# Patient Record
Sex: Male | Born: 1940 | Race: White | Hispanic: No | Marital: Married | Smoking: Never smoker
Health system: Southern US, Community
[De-identification: ages and names within clinical notes are randomized; demographics above are authoritative.]

## PROBLEM LIST (undated history)

## (undated) DIAGNOSIS — E785 Hyperlipidemia, unspecified: Secondary | ICD-10-CM

## (undated) DIAGNOSIS — R112 Nausea with vomiting, unspecified: Secondary | ICD-10-CM

## (undated) DIAGNOSIS — M1990 Unspecified osteoarthritis, unspecified site: Secondary | ICD-10-CM

## (undated) DIAGNOSIS — I10 Essential (primary) hypertension: Secondary | ICD-10-CM

## (undated) DIAGNOSIS — R413 Other amnesia: Secondary | ICD-10-CM

## (undated) DIAGNOSIS — Z9889 Other specified postprocedural states: Secondary | ICD-10-CM

---

## 1999-08-28 ENCOUNTER — Encounter: Payer: Self-pay | Admitting: Orthopaedic Surgery

## 1999-08-28 ENCOUNTER — Ambulatory Visit (HOSPITAL_COMMUNITY): Admission: RE | Admit: 1999-08-28 | Discharge: 1999-08-28 | Payer: Self-pay | Admitting: Orthopaedic Surgery

## 2019-08-03 ENCOUNTER — Other Ambulatory Visit: Payer: Self-pay | Admitting: Neurosurgery

## 2019-08-07 ENCOUNTER — Encounter (HOSPITAL_COMMUNITY): Payer: Self-pay | Admitting: Neurosurgery

## 2019-08-07 NOTE — Progress Notes (Signed)
PCP - Dr Foster Simpson Cardiologist - n/a  Chest x-ray - n/a EKG - DOS 08/08/19 Stress Test - n/a ECHO - n/a Cardiac Cath - n/a  Aspirin Instructions: Follow your surgeon's instructions on when to stop aspirin prior to surgery,  If no instructions were given by your surgeon then you will need to call the office for those instructions.  STOP now taking any Aspirin (unless otherwise instructed by your surgeon), Aleve, Naproxen, Ibuprofen, Motrin, Advil, Goody's, BC's, all herbal medications, fish oil, and all vitamins.   Coronavirus Screening Covid test on DOS 08/08/19  Sister Magda Paganini states patient does not have any of the following: Do you have any of the following symptoms:  Cough yes/no: No Fever (>100.61F)  yes/no: No Runny nose yes/no: No Sore throat yes/no: No Difficulty breathing/shortness of breath  yes/no: No  Have you traveled in the last 14 days and where? yes/no: No  Sister Magda Paganini verbalized understanding of instructions that were given via phone.

## 2019-08-08 ENCOUNTER — Ambulatory Visit (HOSPITAL_COMMUNITY): Payer: Medicare PPO

## 2019-08-08 ENCOUNTER — Ambulatory Visit (HOSPITAL_COMMUNITY): Payer: Medicare PPO | Admitting: Anesthesiology

## 2019-08-08 ENCOUNTER — Ambulatory Visit (HOSPITAL_COMMUNITY)
Admission: RE | Admit: 2019-08-08 | Discharge: 2019-08-09 | Disposition: A | Discharge status: Home / Self Care | Payer: Medicare PPO | Attending: Neurosurgery | Admitting: Neurosurgery

## 2019-08-08 ENCOUNTER — Other Ambulatory Visit: Payer: Self-pay

## 2019-08-08 ENCOUNTER — Encounter (HOSPITAL_COMMUNITY): Payer: Self-pay | Admitting: Neurosurgery

## 2019-08-08 ENCOUNTER — Encounter (HOSPITAL_COMMUNITY)
Admission: RE | Disposition: A | Discharge status: Home / Self Care | Payer: Self-pay | Source: Home / Self Care | Attending: Neurosurgery

## 2019-08-08 DIAGNOSIS — Z7982 Long term (current) use of aspirin: Secondary | ICD-10-CM | POA: Diagnosis not present

## 2019-08-08 DIAGNOSIS — I10 Essential (primary) hypertension: Secondary | ICD-10-CM | POA: Insufficient documentation

## 2019-08-08 DIAGNOSIS — M5126 Other intervertebral disc displacement, lumbar region: Secondary | ICD-10-CM | POA: Diagnosis present

## 2019-08-08 DIAGNOSIS — M5116 Intervertebral disc disorders with radiculopathy, lumbar region: Secondary | ICD-10-CM | POA: Insufficient documentation

## 2019-08-08 DIAGNOSIS — Z20822 Contact with and (suspected) exposure to covid-19: Secondary | ICD-10-CM | POA: Insufficient documentation

## 2019-08-08 DIAGNOSIS — Z79899 Other long term (current) drug therapy: Secondary | ICD-10-CM | POA: Diagnosis not present

## 2019-08-08 DIAGNOSIS — Z419 Encounter for procedure for purposes other than remedying health state, unspecified: Secondary | ICD-10-CM

## 2019-08-08 DIAGNOSIS — M7138 Other bursal cyst, other site: Secondary | ICD-10-CM | POA: Diagnosis not present

## 2019-08-08 DIAGNOSIS — E785 Hyperlipidemia, unspecified: Secondary | ICD-10-CM | POA: Insufficient documentation

## 2019-08-08 DIAGNOSIS — M479 Spondylosis, unspecified: Secondary | ICD-10-CM | POA: Insufficient documentation

## 2019-08-08 DIAGNOSIS — M48061 Spinal stenosis, lumbar region without neurogenic claudication: Secondary | ICD-10-CM | POA: Diagnosis not present

## 2019-08-08 DIAGNOSIS — Z7952 Long term (current) use of systemic steroids: Secondary | ICD-10-CM | POA: Insufficient documentation

## 2019-08-08 HISTORY — DX: Unspecified osteoarthritis, unspecified site: M19.90

## 2019-08-08 HISTORY — DX: Essential (primary) hypertension: I10

## 2019-08-08 HISTORY — DX: Nausea with vomiting, unspecified: R11.2

## 2019-08-08 HISTORY — DX: Hyperlipidemia, unspecified: E78.5

## 2019-08-08 HISTORY — DX: Other specified postprocedural states: Z98.890

## 2019-08-08 HISTORY — DX: Other amnesia: R41.3

## 2019-08-08 HISTORY — PX: LUMBAR LAMINECTOMY/DECOMPRESSION MICRODISCECTOMY: SHX5026

## 2019-08-08 LAB — BASIC METABOLIC PANEL
Anion gap: 13 (ref 5–15)
BUN: 22 mg/dL (ref 8–23)
CO2: 22 mmol/L (ref 22–32)
Calcium: 9.9 mg/dL (ref 8.9–10.3)
Chloride: 102 mmol/L (ref 98–111)
Creatinine, Ser: 1.33 mg/dL — ABNORMAL HIGH (ref 0.61–1.24)
GFR calc Af Amer: 59 mL/min — ABNORMAL LOW (ref >60)
GFR calc non Af Amer: 51 mL/min — ABNORMAL LOW (ref >60)
Glucose, Bld: 105 mg/dL — ABNORMAL HIGH (ref 70–99)
Potassium: 4 mmol/L (ref 3.5–5.1)
Sodium: 137 mmol/L (ref 135–145)

## 2019-08-08 LAB — CBC
HCT: 48.5 % (ref 39.0–52.0)
Hemoglobin: 17 g/dL (ref 13.0–17.0)
MCH: 31 pg (ref 26.0–34.0)
MCHC: 35.1 g/dL (ref 30.0–36.0)
MCV: 88.5 fL (ref 80.0–100.0)
Platelets: 215 10*3/uL (ref 150–400)
RBC: 5.48 MIL/uL (ref 4.22–5.81)
RDW: 13.3 % (ref 11.5–15.5)
WBC: 8.4 10*3/uL (ref 4.0–10.5)
nRBC: 0 % (ref 0.0–0.2)

## 2019-08-08 LAB — SARS CORONAVIRUS 2 BY RT PCR (DIASORIN): SARS Coronavirus 2: NEGATIVE

## 2019-08-08 SURGERY — LUMBAR LAMINECTOMY/DECOMPRESSION MICRODISCECTOMY 1 LEVEL
Anesthesia: General | Site: Back | Laterality: Right

## 2019-08-08 MED ORDER — EPHEDRINE SULFATE 50 MG/ML IJ SOLN
INTRAMUSCULAR | Status: DC | PRN
  Administered 2019-08-08: 5 mg via INTRAVENOUS

## 2019-08-08 MED ORDER — FENTANYL CITRATE (PF) 250 MCG/5ML IJ SOLN
INTRAMUSCULAR | Status: AC
  Filled 2019-08-08: qty 5

## 2019-08-08 MED ORDER — SODIUM CHLORIDE 0.9% FLUSH: 3.0000 mL | INTRAVENOUS | Status: DC | PRN

## 2019-08-08 MED ORDER — ROCURONIUM BROMIDE 10 MG/ML (PF) SYRINGE
PREFILLED_SYRINGE | INTRAVENOUS | Status: AC
  Filled 2019-08-08: qty 20

## 2019-08-08 MED ORDER — PROPOFOL 500 MG/50ML IV EMUL
INTRAVENOUS | Status: DC | PRN
Start: 2019-08-08 — End: 2019-08-08
  Administered 2019-08-08: 25 ug/kg/min via INTRAVENOUS

## 2019-08-08 MED ORDER — MIDAZOLAM HCL 2 MG/2ML IJ SOLN
INTRAMUSCULAR | Status: AC
  Filled 2019-08-08: qty 2

## 2019-08-08 MED ORDER — ORAL CARE MOUTH RINSE: 15.0000 mL | Freq: Once | OROMUCOSAL | Status: AC

## 2019-08-08 MED ORDER — ACETAMINOPHEN 650 MG RE SUPP: 650.0000 mg | RECTAL | Status: DC | PRN

## 2019-08-08 MED ORDER — FENTANYL CITRATE (PF) 250 MCG/5ML IJ SOLN
INTRAMUSCULAR | Status: DC | PRN
  Administered 2019-08-08 (×4): 50 ug via INTRAVENOUS

## 2019-08-08 MED ORDER — ESMOLOL HCL 100 MG/10ML IV SOLN
INTRAVENOUS | Status: AC
  Filled 2019-08-08: qty 10

## 2019-08-08 MED ORDER — PANTOPRAZOLE SODIUM 40 MG PO TBEC
40.0000 mg | DELAYED_RELEASE_TABLET | Freq: Every day | ORAL | Status: DC
  Administered 2019-08-08: 40 mg via ORAL
  Filled 2019-08-08: qty 1

## 2019-08-08 MED ORDER — LIDOCAINE-EPINEPHRINE 1 %-1:100000 IJ SOLN
INTRAMUSCULAR | Status: AC
  Filled 2019-08-08: qty 1

## 2019-08-08 MED ORDER — BUPIVACAINE HCL (PF) 0.25 % IJ SOLN
INTRAMUSCULAR | Status: AC
  Filled 2019-08-08: qty 30

## 2019-08-08 MED ORDER — DIPHENHYDRAMINE HCL 50 MG/ML IJ SOLN
INTRAMUSCULAR | Status: DC | PRN
Start: 2019-08-08 — End: 2019-08-08
  Administered 2019-08-08: 12.5 mg via INTRAVENOUS

## 2019-08-08 MED ORDER — FENTANYL CITRATE (PF) 100 MCG/2ML IJ SOLN
25.0000 ug | INTRAMUSCULAR | Status: DC | PRN
  Administered 2019-08-08: 25 ug via INTRAVENOUS

## 2019-08-08 MED ORDER — DEXAMETHASONE SODIUM PHOSPHATE 10 MG/ML IJ SOLN
10.0000 mg | Freq: Once | INTRAMUSCULAR | Status: DC
  Filled 2019-08-08: qty 1

## 2019-08-08 MED ORDER — LIDOCAINE 2% (20 MG/ML) 5 ML SYRINGE
INTRAMUSCULAR | Status: DC | PRN
  Administered 2019-08-08: 20 mg via INTRAVENOUS

## 2019-08-08 MED ORDER — THROMBIN 5000 UNITS EX SOLR
CUTANEOUS | Status: AC
  Filled 2019-08-08: qty 10000

## 2019-08-08 MED ORDER — CYCLOBENZAPRINE HCL 10 MG PO TABS
10.0000 mg | ORAL_TABLET | Freq: Three times a day (TID) | ORAL | Status: DC | PRN
  Administered 2019-08-09: 10 mg via ORAL
  Filled 2019-08-08: qty 1

## 2019-08-08 MED ORDER — METHYLPREDNISOLONE 4 MG PO TABS
4.0000 mg | ORAL_TABLET | Freq: Every day | ORAL | Status: DC
  Administered 2019-08-08 – 2019-08-09 (×2): 4 mg via ORAL
  Filled 2019-08-08 (×3): qty 1

## 2019-08-08 MED ORDER — AMLODIPINE BESYLATE 5 MG PO TABS
5.0000 mg | ORAL_TABLET | Freq: Every day | ORAL | Status: DC
  Administered 2019-08-08 – 2019-08-09 (×2): 5 mg via ORAL
  Filled 2019-08-08 (×2): qty 1

## 2019-08-08 MED ORDER — ACETAMINOPHEN 500 MG PO TABS
1000.0000 mg | ORAL_TABLET | Freq: Once | ORAL | Status: AC
  Administered 2019-08-08: 1000 mg via ORAL
  Filled 2019-08-08: qty 2

## 2019-08-08 MED ORDER — SODIUM CHLORIDE 0.9 % IV SOLN: INTRAVENOUS | Status: DC | PRN

## 2019-08-08 MED ORDER — ACETAMINOPHEN 325 MG PO TABS: 650.0000 mg | ORAL_TABLET | ORAL | Status: DC | PRN

## 2019-08-08 MED ORDER — ATORVASTATIN CALCIUM 10 MG PO TABS
10.0000 mg | ORAL_TABLET | Freq: Every day | ORAL | Status: DC
  Administered 2019-08-08 – 2019-08-09 (×2): 10 mg via ORAL
  Filled 2019-08-08: qty 1

## 2019-08-08 MED ORDER — ONDANSETRON HCL 4 MG/2ML IJ SOLN: 4.0000 mg | Freq: Four times a day (QID) | INTRAMUSCULAR | Status: DC | PRN

## 2019-08-08 MED ORDER — CHLORHEXIDINE GLUCONATE CLOTH 2 % EX PADS: 6.0000 | MEDICATED_PAD | Freq: Once | CUTANEOUS | Status: DC

## 2019-08-08 MED ORDER — PHENYLEPHRINE HCL-NACL 10-0.9 MG/250ML-% IV SOLN
INTRAVENOUS | Status: DC | PRN
  Administered 2019-08-08: 25 ug/min via INTRAVENOUS

## 2019-08-08 MED ORDER — ONDANSETRON HCL 4 MG PO TABS: 4.0000 mg | ORAL_TABLET | Freq: Four times a day (QID) | ORAL | Status: DC | PRN

## 2019-08-08 MED ORDER — PHENOL 1.4 % MT LIQD: 1.0000 | OROMUCOSAL | Status: DC | PRN

## 2019-08-08 MED ORDER — MIDAZOLAM HCL 2 MG/2ML IJ SOLN
INTRAMUSCULAR | Status: DC | PRN
  Administered 2019-08-08: 1 mg via INTRAVENOUS
  Administered 2019-08-08 (×2): .5 mg via INTRAVENOUS

## 2019-08-08 MED ORDER — FENTANYL CITRATE (PF) 100 MCG/2ML IJ SOLN
25.0000 ug | INTRAMUSCULAR | Status: DC | PRN
  Administered 2019-08-08: 50 ug via INTRAVENOUS

## 2019-08-08 MED ORDER — ONDANSETRON HCL 4 MG/2ML IJ SOLN
INTRAMUSCULAR | Status: AC
  Filled 2019-08-08: qty 4

## 2019-08-08 MED ORDER — LIDOCAINE-EPINEPHRINE 1 %-1:100000 IJ SOLN
INTRAMUSCULAR | Status: DC | PRN
  Administered 2019-08-08: 10 mL

## 2019-08-08 MED ORDER — ACETAMINOPHEN ER 650 MG PO TBCR: 1300.0000 mg | EXTENDED_RELEASE_TABLET | Freq: Four times a day (QID) | ORAL | Status: DC

## 2019-08-08 MED ORDER — CEFAZOLIN SODIUM-DEXTROSE 2-4 GM/100ML-% IV SOLN
2.0000 g | INTRAVENOUS | Status: AC
  Administered 2019-08-08: 2 g via INTRAVENOUS
  Filled 2019-08-08: qty 100

## 2019-08-08 MED ORDER — DEXAMETHASONE SODIUM PHOSPHATE 10 MG/ML IJ SOLN
INTRAMUSCULAR | Status: DC | PRN
  Administered 2019-08-08: 10 mg via INTRAVENOUS

## 2019-08-08 MED ORDER — MENTHOL 3 MG MT LOZG: 1.0000 | LOZENGE | OROMUCOSAL | Status: DC | PRN

## 2019-08-08 MED ORDER — CEFAZOLIN SODIUM-DEXTROSE 2-4 GM/100ML-% IV SOLN
2.0000 g | Freq: Three times a day (TID) | INTRAVENOUS | Status: AC
  Administered 2019-08-09 (×2): 2 g via INTRAVENOUS
  Filled 2019-08-08 (×2): qty 100

## 2019-08-08 MED ORDER — SODIUM CHLORIDE 0.9 % IV SOLN: 250.0000 mL | INTRAVENOUS | Status: DC

## 2019-08-08 MED ORDER — LACTATED RINGERS IV SOLN: INTRAVENOUS | Status: DC

## 2019-08-08 MED ORDER — ROCURONIUM BROMIDE 10 MG/ML (PF) SYRINGE
PREFILLED_SYRINGE | INTRAVENOUS | Status: DC | PRN
  Administered 2019-08-08: 50 mg via INTRAVENOUS

## 2019-08-08 MED ORDER — SODIUM CHLORIDE 0.9% FLUSH
3.0000 mL | Freq: Two times a day (BID) | INTRAVENOUS | Status: DC
  Administered 2019-08-08 – 2019-08-09 (×2): 3 mL via INTRAVENOUS

## 2019-08-08 MED ORDER — CHLORHEXIDINE GLUCONATE 0.12 % MT SOLN
OROMUCOSAL | Status: AC
  Administered 2019-08-08: 15 mL via OROMUCOSAL
  Filled 2019-08-08: qty 15

## 2019-08-08 MED ORDER — DEXAMETHASONE SODIUM PHOSPHATE 10 MG/ML IJ SOLN
INTRAMUSCULAR | Status: AC
  Filled 2019-08-08: qty 2

## 2019-08-08 MED ORDER — OXYCODONE HCL 5 MG PO TABS
10.0000 mg | ORAL_TABLET | ORAL | Status: DC | PRN
  Administered 2019-08-09 (×4): 10 mg via ORAL
  Filled 2019-08-08 (×4): qty 2

## 2019-08-08 MED ORDER — ASPIRIN EC 81 MG PO TBEC
81.0000 mg | DELAYED_RELEASE_TABLET | Freq: Every day | ORAL | Status: DC
  Administered 2019-08-08 – 2019-08-09 (×2): 81 mg via ORAL
  Filled 2019-08-08 (×2): qty 1

## 2019-08-08 MED ORDER — PROPOFOL 10 MG/ML IV BOLUS
INTRAVENOUS | Status: AC
  Filled 2019-08-08: qty 20

## 2019-08-08 MED ORDER — PROPOFOL 10 MG/ML IV BOLUS
INTRAVENOUS | Status: DC | PRN
  Administered 2019-08-08: 140 mg via INTRAVENOUS

## 2019-08-08 MED ORDER — TAMSULOSIN HCL 0.4 MG PO CAPS
0.4000 mg | ORAL_CAPSULE | Freq: Every day | ORAL | Status: DC
  Administered 2019-08-08 – 2019-08-09 (×2): 0.4 mg via ORAL
  Filled 2019-08-08 (×2): qty 1

## 2019-08-08 MED ORDER — PANTOPRAZOLE SODIUM 40 MG IV SOLR: 40.0000 mg | Freq: Every day | INTRAVENOUS | Status: DC

## 2019-08-08 MED ORDER — FENTANYL CITRATE (PF) 100 MCG/2ML IJ SOLN
INTRAMUSCULAR | Status: AC
  Administered 2019-08-08: 25 ug via INTRAVENOUS
  Filled 2019-08-08: qty 2

## 2019-08-08 MED ORDER — PROPOFOL 1000 MG/100ML IV EMUL
INTRAVENOUS | Status: AC
  Filled 2019-08-08: qty 100

## 2019-08-08 MED ORDER — HEMOSTATIC AGENTS (NO CHARGE) OPTIME
TOPICAL | Status: DC | PRN
  Administered 2019-08-08: 1 via TOPICAL

## 2019-08-08 MED ORDER — LIDOCAINE 2% (20 MG/ML) 5 ML SYRINGE
INTRAMUSCULAR | Status: AC
  Filled 2019-08-08: qty 10

## 2019-08-08 MED ORDER — THROMBIN 5000 UNITS EX SOLR
CUTANEOUS | Status: DC | PRN
  Administered 2019-08-08 (×2): 5000 [IU] via TOPICAL

## 2019-08-08 MED ORDER — ONDANSETRON HCL 4 MG/2ML IJ SOLN: 4.0000 mg | Freq: Once | INTRAMUSCULAR | Status: DC | PRN

## 2019-08-08 MED ORDER — BUPIVACAINE HCL (PF) 0.25 % IJ SOLN
INTRAMUSCULAR | Status: DC | PRN
  Administered 2019-08-08: 10 mL

## 2019-08-08 MED ORDER — PHENYLEPHRINE 40 MCG/ML (10ML) SYRINGE FOR IV PUSH (FOR BLOOD PRESSURE SUPPORT)
PREFILLED_SYRINGE | INTRAVENOUS | Status: AC
  Filled 2019-08-08: qty 20

## 2019-08-08 MED ORDER — HYDROCODONE-ACETAMINOPHEN 5-325 MG PO TABS
1.0000 | ORAL_TABLET | Freq: Four times a day (QID) | ORAL | Status: DC
  Administered 2019-08-08: 1 via ORAL
  Filled 2019-08-08 (×2): qty 1

## 2019-08-08 MED ORDER — ALUM & MAG HYDROXIDE-SIMETH 200-200-20 MG/5ML PO SUSP: 30.0000 mL | Freq: Four times a day (QID) | ORAL | Status: DC | PRN

## 2019-08-08 MED ORDER — PHENYLEPHRINE HCL (PRESSORS) 10 MG/ML IV SOLN
INTRAVENOUS | Status: DC | PRN
  Administered 2019-08-08 (×2): 80 ug via INTRAVENOUS

## 2019-08-08 MED ORDER — HYDROMORPHONE HCL 1 MG/ML IJ SOLN: 0.5000 mg | INTRAMUSCULAR | Status: DC | PRN

## 2019-08-08 MED ORDER — CHLORHEXIDINE GLUCONATE 0.12 % MT SOLN: 15.0000 mL | Freq: Once | OROMUCOSAL | Status: AC

## 2019-08-08 MED ORDER — DIPHENHYDRAMINE HCL 50 MG/ML IJ SOLN
INTRAMUSCULAR | Status: AC
  Filled 2019-08-08: qty 1

## 2019-08-08 MED ORDER — 0.9 % SODIUM CHLORIDE (POUR BTL) OPTIME
TOPICAL | Status: DC | PRN
  Administered 2019-08-08: 1000 mL

## 2019-08-08 SURGICAL SUPPLY — 62 items
BAG DECANTER FOR FLEXI CONT (MISCELLANEOUS) ×3 IMPLANT
BAND RUBBER #18 3X1/16 STRL (MISCELLANEOUS) ×6 IMPLANT
BENZOIN TINCTURE PRP APPL 2/3 (GAUZE/BANDAGES/DRESSINGS) ×3 IMPLANT
BLADE CLIPPER SURG (BLADE) ×3 IMPLANT
BLADE SURG 11 STRL SS (BLADE) ×3 IMPLANT
BUR CUTTER 7.0 ROUND (BURR) ×3 IMPLANT
BUR MATCHSTICK NEURO 3.0 LAGG (BURR) ×3 IMPLANT
CANISTER SUCT 3000ML PPV (MISCELLANEOUS) ×3 IMPLANT
CARTRIDGE OIL MAESTRO DRILL (MISCELLANEOUS) ×1 IMPLANT
CLOSURE WOUND 1/2 X4 (GAUZE/BANDAGES/DRESSINGS) ×1
CONT SPEC 4OZ CLIKSEAL STRL BL (MISCELLANEOUS) ×6 IMPLANT
COVER WAND RF STERILE (DRAPES) IMPLANT
DECANTER SPIKE VIAL GLASS SM (MISCELLANEOUS) ×3 IMPLANT
DERMABOND ADVANCED (GAUZE/BANDAGES/DRESSINGS) ×2
DERMABOND ADVANCED .7 DNX12 (GAUZE/BANDAGES/DRESSINGS) ×1 IMPLANT
DIFFUSER DRILL AIR PNEUMATIC (MISCELLANEOUS) ×3 IMPLANT
DRAPE HALF SHEET 40X57 (DRAPES) IMPLANT
DRAPE LAPAROTOMY 100X72X124 (DRAPES) ×3 IMPLANT
DRAPE MICROSCOPE LEICA (MISCELLANEOUS) ×3 IMPLANT
DRAPE SURG 17X23 STRL (DRAPES) ×3 IMPLANT
DRSG OPSITE POSTOP 4X6 (GAUZE/BANDAGES/DRESSINGS) ×3 IMPLANT
DURAPREP 26ML APPLICATOR (WOUND CARE) ×3 IMPLANT
ELECT BLADE 4.0 EZ CLEAN MEGAD (MISCELLANEOUS) ×3
ELECT REM PT RETURN 9FT ADLT (ELECTROSURGICAL) ×3
ELECTRODE BLDE 4.0 EZ CLN MEGD (MISCELLANEOUS) ×1 IMPLANT
ELECTRODE REM PT RTRN 9FT ADLT (ELECTROSURGICAL) ×1 IMPLANT
GAUZE 4X4 16PLY RFD (DISPOSABLE) IMPLANT
GAUZE SPONGE 4X4 12PLY STRL (GAUZE/BANDAGES/DRESSINGS) ×3 IMPLANT
GLOVE BIO SURGEON STRL SZ7 (GLOVE) IMPLANT
GLOVE BIO SURGEON STRL SZ8 (GLOVE) ×3 IMPLANT
GLOVE BIOGEL PI IND STRL 7.0 (GLOVE) IMPLANT
GLOVE BIOGEL PI IND STRL 7.5 (GLOVE) ×3 IMPLANT
GLOVE BIOGEL PI IND STRL 8 (GLOVE) ×2 IMPLANT
GLOVE BIOGEL PI INDICATOR 7.0 (GLOVE)
GLOVE BIOGEL PI INDICATOR 7.5 (GLOVE) ×6
GLOVE BIOGEL PI INDICATOR 8 (GLOVE) ×4
GLOVE EXAM NITRILE XL STR (GLOVE) IMPLANT
GLOVE INDICATOR 8.5 STRL (GLOVE) ×3 IMPLANT
GLOVE SURG SS PI 7.0 STRL IVOR (GLOVE) ×12 IMPLANT
GLOVE SURG SS PI 7.5 STRL IVOR (GLOVE) ×9 IMPLANT
GOWN STRL REUS W/ TWL LRG LVL3 (GOWN DISPOSABLE) ×2 IMPLANT
GOWN STRL REUS W/ TWL XL LVL3 (GOWN DISPOSABLE) ×4 IMPLANT
GOWN STRL REUS W/TWL 2XL LVL3 (GOWN DISPOSABLE) IMPLANT
GOWN STRL REUS W/TWL LRG LVL3 (GOWN DISPOSABLE) ×6
GOWN STRL REUS W/TWL XL LVL3 (GOWN DISPOSABLE) ×12
KIT BASIN OR (CUSTOM PROCEDURE TRAY) ×3 IMPLANT
KIT TURNOVER KIT B (KITS) ×3 IMPLANT
NEEDLE HYPO 22GX1.5 SAFETY (NEEDLE) ×3 IMPLANT
NEEDLE SPNL 22GX3.5 QUINCKE BK (NEEDLE) ×3 IMPLANT
NS IRRIG 1000ML POUR BTL (IV SOLUTION) ×3 IMPLANT
OIL CARTRIDGE MAESTRO DRILL (MISCELLANEOUS) ×3
PACK LAMINECTOMY NEURO (CUSTOM PROCEDURE TRAY) ×3 IMPLANT
SPONGE SURGIFOAM ABS GEL SZ50 (HEMOSTASIS) ×3 IMPLANT
STRIP CLOSURE SKIN 1/2X4 (GAUZE/BANDAGES/DRESSINGS) ×2 IMPLANT
SUT VIC AB 0 CT1 18XCR BRD8 (SUTURE) ×1 IMPLANT
SUT VIC AB 0 CT1 8-18 (SUTURE) ×3
SUT VIC AB 2-0 CT1 18 (SUTURE) ×3 IMPLANT
SUT VICRYL 4-0 PS2 18IN ABS (SUTURE) ×3 IMPLANT
SYR BULB IRRIG 60ML STRL (SYRINGE) ×3 IMPLANT
TOWEL GREEN STERILE (TOWEL DISPOSABLE) ×3 IMPLANT
TOWEL GREEN STERILE FF (TOWEL DISPOSABLE) ×3 IMPLANT
WATER STERILE IRR 1000ML POUR (IV SOLUTION) ×3 IMPLANT

## 2019-08-08 NOTE — H&P (Signed)
Dale Barker is an 79 y.o. male.   Chief Complaint: Back and right leg pain HPI: 80 year old gentleman with progressive worsening back and right leg pain rating down L4-L5 nerve root pattern work-up revealed severe foraminal stenosis with a foraminal disc condition on the right at L4-5.  Due to patient's progression of clinical syndrome imaging findings and failed conservative treatment of recommended lumbar microdiscectomy L4-5 as well as extraforaminal discectomy L4-5 on the right I extensively gone over the risks and benefits of the operation with him as well as perioperative course expectations of outcome and alternatives of surgery and he understood and agreed to proceed forward.  Past Medical History:  Diagnosis Date  . Arthritis    spine  . HLD (hyperlipidemia)   . Hypertension   . Memory loss    x 2 years, per Cyndra Numbers  . PONV (postoperative nausea and vomiting)     Past Surgical History:  Procedure Laterality Date  . APPENDECTOMY    . BACK SURGERY     L4-L5 decompression [facility]  . KNEE SURGERY    . SHOULDER SURGERY    . TONSILLECTOMY      History reviewed. No pertinent family history. Social History:  reports that he has never smoked. He has never used smokeless tobacco. He reports current alcohol use. He reports that he does not use drugs.  Allergies: No Known Allergies  Medications Prior to Admission  Medication Sig Dispense Refill  . acetaminophen (TYLENOL) 650 MG CR tablet Take 1,300 mg by mouth every 6 (six) hours.    Marland Kitchen amLODipine (NORVASC) 5 MG tablet Take 5 mg by mouth daily.    Marland Kitchen atorvastatin (LIPITOR) 10 MG tablet Take 10 mg by mouth daily.    Marland Kitchen HYDROcodone-acetaminophen (NORCO/VICODIN) 5-325 MG tablet Take 1 tablet by mouth every 6 (six) hours.    . methylPREDNISolone (MEDROL) 4 MG tablet Take 4 mg by mouth daily. Dose pack    . aspirin EC 81 MG tablet Take 81 mg by mouth daily. Swallow whole.      Results for orders placed or performed during  the hospital encounter of 08/08/19 (from the past 48 hour(s))  SARS Coronavirus 2 by RT PCR     Status: None   Collection Time: 08/08/19  1:09 PM  Result Value Ref Range   SARS Coronavirus 2 NEGATIVE NEGATIVE    Comment: (NOTE) Result indicates the ABSENCE of SARS-CoV-2 RNA in the patient specimen.   The lowest concentration of SARS-CoV-2 viral copies this assay can detect in nasopharyngeal swab specimens is 500 copies / mL.  A negative result does not preclude SARS-CoV-2 infection and should not be used as the sole basis for patient management decisions. A negative result may occur with improper specimen collection / handling, submission of a specimen other than nasopharyngeal swab, presence of viral mutation(s) within the areas targeted by this assay, and inadequate number of viral copies (<500 copies / mL) present.  Negative results must be combined with clinical observations, patient history, and epidemiological information.   The expected result is NEGATIVE.   Patient Fact Sheet:  https://wong-henderson.biz/    Provider Fact Sheet:  CheapJackpot.at    This test is not yet approved or cleared by the [place] FDA and  has been [place] orized for detection and/or diagnosis of SARS-CoV-2 by FDA under an Emergency Use Authorization (EUA).  This EUA will remain in effect (meaning this test can be used) for the duration of  the COVID-19 declaration under Section  564(b)(1) of the Act, 21 U.S.C. section 360bbb-3(b)(1), unless the authorization is terminated or revoked sooner  Performed at [facility], [address], [place], [place] [address]   Basic metabolic panel per protocol     Status: Abnormal   Collection Time: 08/08/19  2:15 PM  Result Value Ref Range   Sodium 137 135 - 145 mmol/L   Potassium 4.0 3.5 - 5.1 mmol/L   Chloride 102 98 - 111 mmol/L   CO2 22 22 - 32 mmol/L   Glucose, Bld 105 (H) 70 - 99 mg/dL    Comment:  Glucose reference range applies only to samples taken after fasting for at least 8 hours.   BUN 22 8 - 23 mg/dL   Creatinine, Ser 9.81 (H) 0.61 - 1.24 mg/dL   Calcium 9.9 8.9 - 19.1 mg/dL   GFR calc non Af Amer 51 (L) >60 mL/min   GFR calc Af Amer 59 (L) >60 mL/min   Anion gap 13 5 - 15    Comment: Performed at [facility], [address] [place], [place] [address]  CBC per protocol     Status: None   Collection Time: 08/08/19  2:15 PM  Result Value Ref Range   WBC 8.4 4.0 - 10.5 K/uL   RBC 5.48 4.22 - 5.81 MIL/uL   Hemoglobin 17.0 13.0 - 17.0 g/dL   HCT 56.2 39 - 52 %   MCV 88.5 80.0 - 100.0 fL   MCH 31.0 26.0 - 34.0 pg   MCHC 35.1 30.0 - 36.0 g/dL   RDW 13.0 86.5 - 78.4 %   Platelets 215 150 - 400 K/uL   nRBC 0.0 0.0 - 0.2 %    Comment: Performed at [facility], [address] [place], [place] [address]   No results found.  Review of Systems  Neurological: Positive for weakness and numbness.    Blood pressure (!) 157/101, pulse 73, temperature 98.2 F (36.8 C), temperature source Oral, resp. rate 18, height 5\' 10"  (1.778 m), weight 81.6 kg, SpO2 100 %. Physical Exam HENT:     Head: Normocephalic.     Nose: Nose normal.  Eyes:     Pupils: Pupils are equal, round, and reactive to light.  Cardiovascular:     Rate and Rhythm: Normal rate.  Pulmonary:     Effort: Pulmonary effort is normal.  Abdominal:     General: Abdomen is flat.  Musculoskeletal:        General: Normal range of motion.  Skin:    General: Skin is warm.  Neurological:     General: No focal deficit present.     Mental Status: He is alert.     Comments: Strength is 5-5 iliopsoas, quads, hamstrings, gastroc, and tibialis, and EHL.      Assessment/Plan 79 years old presents for right-sided L4-5 extraforaminal discectomy and and lumbar microdiscectomy  64, MD 08/08/2019, 4:00 PM

## 2019-08-08 NOTE — Anesthesia Procedure Notes (Signed)
Procedure Name: Intubation Date/Time: 08/08/2019 4:17 PM Performed by: Clearnce Sorrel, CRNA Pre-anesthesia Checklist: Patient identified, Emergency Drugs available, Suction available, Patient being monitored and Timeout performed Patient Re-evaluated:Patient Re-evaluated prior to induction Oxygen Delivery Method: Circle system utilized Preoxygenation: Pre-oxygenation with 100% oxygen Induction Type: IV induction Ventilation: Mask ventilation without difficulty and Oral airway inserted - appropriate to patient size Laryngoscope Size: Mac and 4 Grade View: Grade III Tube type: Oral Tube size: 7.5 mm Number of attempts: 2 Airway Equipment and Method: Stylet and Bougie stylet Placement Confirmation: positive ETCO2 and breath sounds checked- equal and bilateral Secured at: 23 cm Tube secured with: Tape Dental Injury: Teeth and Oropharynx as per pre-operative assessment

## 2019-08-08 NOTE — Op Note (Signed)
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  Procedure: #1 extraforaminal discectomy L4-5 on the right with microdissection of the right L4 nerve root microscopic discectomy #2 lumbar laminectomy microdiscectomy L4-5 on the right with microscopic dissection of the L5 nerve root nerve root resection of synovial cyst and microdiscectomy  Surgeon: Jillyn Hidden Keontay Vora  Anesthesia: General  EBL: Minimal  HPI: 79 year old gentleman is a progressive worsening back and right leg pain work-up revealed severe spinal stenosis and herniated nucleus pulposus compressing the right L4 and L5 nerve roots and due to the patient's progression of clinical syndrome imaging findings and failed conservative treatment I recommended decompression discectomy both extraforaminal and within the canal I went over the risks and benefits of the operation with him as well as perioperative course expectations of outcome and alternatives of surgery and he understood and agreed to proceed forward.  Operative procedure: Patient was brought into the OR was due to general anesthesia positioned prone on the Wilson frame his back was prepped and draped in routine sterile fashion.  Preoperative x-ray localized the appropriate level so after infiltration of 10 cc lidocaine with epi a midline incision was made and Bovie electrocautery was used take down subcutaneous tissue and subperiosteal dissection was carried out on the lamina of L4 and L5 on the right.  Intraoperative x-ray identified the L4 pedicle as well as the L4-5 interspace.  So utilizing high-speed drill the inferior lamina of L4 medial facet complex superior aspect of L5 was drilled down as well as in the extraforaminal space above that the superior aspect 4 5 facet lateral pars and inferior aspect of 3 4 facet.  The drilling down of  the extraforaminal space there was extensive mount of overgrowth of both facet complexes required extensive mount and drilling to get to the intertransverse ligament did this and then with him within the canal removed extensive mount of hypertrophied ligamentum flavum immediately identified a synovial cyst at this point the operating microscope was draped and brought into the field the cyst was then resected off the lateral dura under microscopic lamination and the L5 nerve root was decompressed in the foramen.  I then inspected the disc base disc base was bulging using this is a landmark I went back to the extraforaminal space drilled through the spur identified the intertransverse ligament which was markedly hypertrophied remove this and identified the L4 nerve root.  Inferior to the L4 nerve root there was a bulging disc I cut into this removed several pieces of the bulging disc and then working from inside the canal and outside the canal back and forth I removed extensive my disc material from under the facet joint and hypertrophied ligament and removed a lot of spur from the medial aspect the facet joint as well.  At the end of decompression and discectomy at both levels the thecal sac L5 and L4 nerve root were widely decompressed.  Wound was then copiously irrigated and meticulous hemostasis was maintained the wound was closed in layers with Vicryl skin was closed running 4 subcuticular Dermabond benzoin Steri-Strips and a sterile dressing was applied and patient went recovery in stable condition.  At the end the case all needle count sponge counts were correct.

## 2019-08-08 NOTE — Anesthesia Postprocedure Evaluation (Signed)
Anesthesia Post Note  Patient: Dale Barker  Procedure(s) Performed: Laminectomy and Foraminotomy - Lumbar four-five- right with intra and extraforaminal diskectomy decompression (Right Back)     Patient location during evaluation: PACU Anesthesia Type: General Level of consciousness: awake and alert Pain management: pain level controlled Vital Signs Assessment: post-procedure vital signs reviewed and stable Respiratory status: spontaneous breathing, nonlabored ventilation, respiratory function stable and patient connected to nasal cannula oxygen Cardiovascular status: blood pressure returned to baseline and stable Postop Assessment: no apparent nausea or vomiting Anesthetic complications: no   No complications documented.  Last Vitals:  Vitals:   08/08/19 2028 08/08/19 2306  BP: (!) 178/80 (!) 135/67  Pulse: 76 73  Resp: 18 20  Temp: 36.9 C 37.1 C  SpO2: 100% 99%    Last Pain:  Vitals:   08/08/19 2306  TempSrc: Oral  PainSc:                  Kennieth Rad

## 2019-08-08 NOTE — Anesthesia Preprocedure Evaluation (Addendum)
Anesthesia Evaluation  Patient identified by MRN, date of birth, ID band Patient awake    Reviewed: Allergy & Precautions, NPO status , Patient's Chart, lab work & pertinent test results  History of Anesthesia Complications (+) PONV and history of anesthetic complications  Airway Mallampati: II  TM Distance: >3 FB Neck ROM: Full    Dental  (+) Teeth Intact, Dental Advisory Given   Pulmonary neg pulmonary ROS,    Pulmonary exam normal breath sounds clear to auscultation       Cardiovascular hypertension, Pt. on medications Normal cardiovascular exam Rhythm:Regular Rate:Normal     Neuro/Psych Memory loss Lumbar stenosis  negative psych ROS   GI/Hepatic negative GI ROS, Neg liver ROS,   Endo/Other  negative endocrine ROS  Renal/GU negative Renal ROS     Musculoskeletal  (+) Arthritis ,   Abdominal   Peds  Hematology negative hematology ROS (+)   Anesthesia Other Findings Day of surgery medications reviewed with the patient.  Reproductive/Obstetrics                             Anesthesia Physical Anesthesia Plan  ASA: II  Anesthesia Plan: General   Post-op Pain Management:    Induction: Intravenous  PONV Risk Score and Plan: 3 and Dexamethasone, Ondansetron, Treatment may vary due to age or medical condition and Propofol infusion  Airway Management Planned: Oral ETT  Additional Equipment:   Intra-op Plan:   Post-operative Plan: Extubation in OR  Informed Consent: I have reviewed the patients History and Physical, chart, labs and discussed the procedure including the risks, benefits and alternatives for the proposed anesthesia with the patient or authorized representative who has indicated his/her understanding and acceptance.     Dental advisory given  Plan Discussed with: CRNA  Anesthesia Plan Comments:        Anesthesia Quick Evaluation

## 2019-08-08 NOTE — Transfer of Care (Signed)
Immediate Anesthesia Transfer of Care Note  Patient: Dale Barker Jun  Procedure(s) Performed: Laminectomy and Foraminotomy - Lumbar four-five- right with intra and extraforaminal diskectomy decompression (Right Back)  Patient Location: PACU  Anesthesia Type:General  Level of Consciousness: awake and alert   Airway & Oxygen Therapy: Patient Spontanous Breathing  Post-op Assessment: Report given to RN  Post vital signs: Reviewed  Last Vitals:  Vitals Value Taken Time  BP 135/67 08/08/19 2306  Temp 37.1 C 08/08/19 2306  Pulse 73 08/08/19 2306  Resp 20 08/08/19 2306  SpO2 99 % 08/08/19 2306    Last Pain:  Vitals:   08/08/19 2306  TempSrc: Oral  PainSc:       Patients Stated Pain Goal: 3 (08/08/19 1356)  Complications: No complications documented.

## 2019-08-09 ENCOUNTER — Encounter (HOSPITAL_COMMUNITY): Payer: Self-pay | Admitting: Neurosurgery

## 2019-08-09 DIAGNOSIS — M5116 Intervertebral disc disorders with radiculopathy, lumbar region: Secondary | ICD-10-CM | POA: Diagnosis not present

## 2019-08-09 MED ORDER — METHOCARBAMOL 500 MG PO TABS: 500.0000 mg | ORAL_TABLET | Freq: Four times a day (QID) | ORAL | 0 refills | Status: AC

## 2019-08-09 NOTE — Discharge Instructions (Signed)
Wound Care  Keep the incision clean and dry remove the outer dressing in 2 days, leave the Steri-Strips intact.  Do not put any creams, lotions, or ointments on incision. Leave steri-strips on back.  They will fall off by themselves.  Activity Walk each and every day, increasing distance each day. No lifting greater than 5 lbs.  No lifting no bending no twisting no driving or riding a car unless coming back and forth to see me.  Diet Resume your normal diet.   Return to Work Will be discussed at you follow up appointment.  Call Your Doctor If Any of These Occur Redness, drainage, or swelling at the wound.  Temperature greater than 101 degrees. Severe pain not relieved by pain medication. Incision starts to come apart. Follow Up Appt Call [phone])  for problems.

## 2019-08-09 NOTE — Evaluation (Signed)
Occupational Therapy Evaluation Patient Details Name: Dale Barker MRN: [id number] DOB: 09-Apr-1940 Today's Date: 08/09/2019    History of Present Illness Pt is a 79 y/o male s/p L4-L5 laminectomy/foraminectomy. PMH including but not limited to HTN and HLD.   Clinical Impression   Pt was independent prior to admission. Presents with R LE pain, unsteady gait and impaired memory. His plan is to go home with his sister. Pt is currently functioning at a supervision level in ADL. Will likely need cues to adhere to back precautions upon discharge.     Follow Up Recommendations  No OT follow up    Equipment Recommendations  None recommended by OT    Recommendations for Other Services       Precautions / Restrictions Precautions Precautions: Back;Fall Precaution Booklet Issued: Yes (comment) Precaution Comments: reviewed precautions with pt throughout in relation to mobility Restrictions Weight Bearing Restrictions: No      Mobility Bed Mobility Overal bed mobility: Needs Assistance Bed Mobility: Rolling;Sidelying to Sit;Sit to Sidelying Rolling: Supervision Sidelying to sit: Supervision     Sit to sidelying: Supervision General bed mobility comments: cueing for log roll technique  Transfers Overall transfer level: Needs assistance Equipment used: Rolling walker (2 wheeled) Transfers: Sit to/from Stand Sit to Stand: Supervision         General transfer comment: for safety, cues for hand placement    Balance Overall balance assessment: Needs assistance Sitting-balance support: Feet supported Sitting balance-Leahy Scale: Good     Standing balance support: During functional activity Standing balance-Leahy Scale: Fair                             ADL either performed or assessed with clinical judgement   ADL Overall ADL's : Needs assistance/impaired Eating/Feeding: Independent   Grooming: Standing;Supervision/safety Grooming Details (indicate cue  type and reason): educated in 2 cup method for oral care and washcloth to wash face Upper Body Bathing: Set up;With adaptive equipment;Sitting   Lower Body Bathing: Sit to/from stand;With adaptive equipment;Supervison/ safety Lower Body Bathing Details (indicate cue type and reason): recommended use of long handled bath sponge for back and feet Upper Body Dressing : Set up;Sitting   Lower Body Dressing: Sit to/from stand;Supervision/safety Lower Body Dressing Details (indicate cue type and reason): pt able to perform figure 4 method to don socks Toilet Transfer: Min guard;Ambulation;RW     Toileting - Clothing Manipulation Details (indicate cue type and reason): instructed to avoid twisting with pericare     Functional mobility during ADLs: Min guard;Rolling walker General ADL Comments: Educated pt in IADL to avoid.     Vision Patient Visual Report: No change from baseline       Perception     Praxis      Pertinent Vitals/Pain Pain Assessment: Faces Faces Pain Scale: Hurts little more Pain Location: R LE Pain Descriptors / Indicators: Sore Pain Intervention(s): Repositioned     Hand Dominance Right   Extremity/Trunk Assessment Upper Extremity Assessment Upper Extremity Assessment: Overall WFL for tasks assessed   Lower Extremity Assessment Lower Extremity Assessment: Defer to PT evaluation   Cervical / Trunk Assessment Cervical / Trunk Assessment: Other exceptions Cervical / Trunk Exceptions: s/p lumbar sx   Communication Communication Communication: No difficulties   Cognition Arousal/Alertness: Awake/alert Behavior During Therapy: WFL for tasks assessed/performed Overall Cognitive Status: History of cognitive impairments - at baseline  General Comments: pt with baseline memory deficits   General Comments       Exercises     Shoulder Instructions      Home Living Family/patient expects to be discharged  to:: Private residence Living Arrangements: Other (Comment) (staying with sister post op) Available Help at Discharge: Family;Available 24 hours/day Type of Home: House Home Access: Stairs to enter [employer] of Steps: 4 Entrance Stairs-Rails: Right;Left Home Layout: One level     Bathroom Shower/Tub: Producer, television/film/video: Handicapped height     Home Equipment: Emergency planning/management officer - 2 wheels;Adaptive equipment;Hand held shower head Adaptive Equipment: Reacher;Long-handled sponge        Prior Functioning/Environment Level of Independence: Independent                 OT Problem List: Impaired balance (sitting and/or standing);Pain;Decreased knowledge of use of DME or AE;Decreased cognition      OT Treatment/Interventions: Self-care/ADL training;DME and/or AE instruction;Patient/family education;Balance training;Therapeutic activities    OT Goals(Current goals can be found in the care plan section) Acute Rehab OT Goals Patient Stated Goal: decrease pain OT Goal Formulation: With patient Time For Goal Achievement: 08/23/19 Potential to Achieve Goals: Good ADL Goals Pt Will Perform Grooming: with modified independence;standing Pt Will Perform Lower Body Bathing: with modified independence;sit to/from stand Pt Will Perform Lower Body Dressing: with modified independence;sit to/from stand Pt Will Transfer to Toilet: with modified independence;ambulating Pt Will Perform Toileting - Clothing Manipulation and hygiene: with modified independence;sit to/from stand Pt Will Perform Tub/Shower Transfer: Shower transfer;with modified independence;ambulating;shower seat Additional ADL Goal #1: Pt will perform bed mobility modified independently adhering to back precautions.  OT Frequency: Min 2X/week   Barriers to D/C:            Co-evaluation              AM-PAC OT "6 Clicks" Daily Activity     Outcome Measure Help from another person eating  meals?: None Help from another person taking care of personal grooming?: A Little Help from another person toileting, which includes using toliet, bedpan, or urinal?: A Little Help from another person bathing (including washing, rinsing, drying)?: A Little Help from another person to put on and taking off regular upper body clothing?: None Help from another person to put on and taking off regular lower body clothing?: A Little 6 Click Score: 20   End of Session Equipment Utilized During Treatment: Gait belt;Rolling walker  Activity Tolerance: Patient tolerated treatment well Patient left: in bed;with call bell/phone within reach;with family/visitor present  OT Visit Diagnosis: Unsteadiness on feet (R26.81);Pain;Other symptoms and signs involving cognitive function                Time: 0850-0907 OT Time Calculation (min): 17 min Charges:  OT General Charges $OT Visit: 1 Visit OT Evaluation $OT Eval Low Complexity: 1 Low  Martie Round, OTR/L Acute Rehabilitation Services Pager: [phone] Office: [phone] Evern Bio 08/09/2019, 10:30 AM

## 2019-08-09 NOTE — Plan of Care (Signed)
Patient alert and oriented, mae's well, voiding adequate amount of urine, swallowing without difficulty, no c/o pain at time of discharge. Patient discharged home with family. Script and discharged instructions given to patient. Patient and family stated understanding of instructions given. Patient has an appointment with Dr. Cram 

## 2019-08-09 NOTE — Discharge Summary (Addendum)
  Physician Discharge Summary  Patient ID: Dale Barker MRN: [id number] DOB/AGE: May 03, 1940 79 y.o. Estimated body mass index is 25.81 kg/m as calculated from the following:   Height as of this encounter: 5\' 10"  (1.778 m).   Weight as of this encounter: 81.6 kg.   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  Patient be described scheduled follow-up in 1 to 2 weeks.  Consults: Significant Diagnostic Studies: Treatments: L4-5 decompression discectomy Discharge Exam: Blood pressure 123/74, pulse 86, temperature 98.2 F (36.8 C), resp. rate 18, height 5\' 10"  (1.778 m), weight 81.6 kg, SpO2 97 %. Strength 5 out of 5 wound clean dry and intact  Disposition: Home   Allergies as of 08/09/2019   No Known Allergies     Medication List    TAKE these medications   acetaminophen 650 MG CR tablet Commonly known as: TYLENOL Take 1,300 mg by mouth every 6 (six) hours.   amLODipine 5 MG tablet Commonly known as: NORVASC Take 5 mg by mouth daily.   aspirin EC 81 MG tablet Take 81 mg by mouth daily. Swallow whole.   atorvastatin 10 MG tablet Commonly known as: LIPITOR Take 10 mg by mouth daily.   HYDROcodone-acetaminophen 5-325 MG tablet Commonly known as: NORCO/VICODIN Take 1 tablet by mouth every 6 (six) hours.   methocarbamol 500 MG tablet Commonly known as: Robaxin Take 1 tablet (500 mg total) by mouth 4 (four) times daily.    methylPREDNISolone 4 MG tablet Commonly known as: MEDROL Take 4 mg by mouth daily. Dose pack       Follow-up Information    , MD Follow up.   Specialty: Neurosurgery Contact information: [address] [place] [address] [place] [phone]               Signed: [id number] [facility] 08/09/2019, 12:46 PM

## 2019-08-09 NOTE — Evaluation (Signed)
Physical Therapy Evaluation Patient Details Name: Dale Barker MRN: [id number] DOB: [birth year] Today's Date: 08/09/2019   History of Present Illness  Pt is a 79 y/o male s/p L4-L5 laminectomy/foraminectomy. PMH including but not limited to HTN and HLD.  Clinical Impression  Pt presented supine in bed with HOB elevated, awake and willing to participate in therapy session. Prior to admission, pt reported that he was independent with all functional mobility and ADLs. Pt is planning to d/c to his sister's home where he will be able to live on the main level with the appropriate assistance from family. At the time of evaluation, pt overall at a supervision to min guard level with all functional mobility including hallway ambulation. Pt with increased R LE pain throughout session which limited his distance ambulating; however, no LOB or need for physical assistance. PT provided pt education re: back precautions with handout provided, car transfers and a generalized walking program for pt to initiate upon d/c home. PT will continue to f/u with pt acutely to progress mobility as tolerated per PT POC.    Follow Up Recommendations No PT follow up;Other (comment) (OPPT follow-up once cleared by MD)    Equipment Recommendations  None recommended by PT    Recommendations for Other Services       Precautions / Restrictions Precautions Precautions: Back Precaution Booklet Issued: Yes (comment) Precaution Comments: reviewed precautions with pt throughout in relation to mobility Restrictions Weight Bearing Restrictions: No      Mobility  Bed Mobility Overal bed mobility: Needs Assistance Bed Mobility: Rolling;Sidelying to Sit;Sit to Sidelying Rolling: Supervision Sidelying to sit: Supervision     Sit to sidelying: Supervision General bed mobility comments: cueing for log roll technique  Transfers Overall transfer level: Needs assistance Equipment used: None Transfers: Sit to/from  Stand Sit to Stand: Supervision         General transfer comment: for safety with transition  Ambulation/Gait Ambulation/Gait assistance: Min guard Gait Distance (Feet): 75 Feet Assistive device: None;Rolling walker (2 wheeled) Gait Pattern/deviations: Step-to pattern;Decreased step length - left;Decreased stance time - right;Decreased stride length;Decreased weight shift to right;Antalgic Gait velocity: decreased   General Gait Details: pt initially ambulating ~40' without an AD; however, progressively becoming more antalgic with gait pattern secondary to worsening R LE pain. Pt then trialing RW to relieve R LE pain with ambulation  Stairs            Wheelchair Mobility    Modified Rankin (Stroke Patients Only)       Balance Overall balance assessment: Needs assistance Sitting-balance support: Feet supported Sitting balance-Leahy Scale: Good     Standing balance support: During functional activity Standing balance-Leahy Scale: Fair                               Pertinent Vitals/Pain Pain Assessment: Faces Faces Pain Scale: Hurts even more Pain Location: R LE Pain Descriptors / Indicators: Sore Pain Intervention(s): Monitored during session;Repositioned    Home Living Family/patient expects to be discharged to:: Private residence Living Arrangements: Other relatives;Other (Comment) (staying with his sister) Available Help at Discharge: Family Type of Home: House Home Access: Stairs to enter Entrance Stairs-Rails: Doctor, general practice of Steps: 4 Home Layout: One level Home Equipment: Emergency planning/management officer - 2 wheels      Prior Function Level of Independence: Independent               Hand Dominance  Extremity/Trunk Assessment   Upper Extremity Assessment Upper Extremity Assessment: Defer to OT evaluation;Overall WFL for tasks assessed    Lower Extremity Assessment Lower Extremity Assessment: Overall WFL for  tasks assessed    Cervical / Trunk Assessment Cervical / Trunk Assessment: Other exceptions Cervical / Trunk Exceptions: s/p lumbar sx  Communication   Communication: No difficulties  Cognition Arousal/Alertness: Awake/alert Behavior During Therapy: WFL for tasks assessed/performed Overall Cognitive Status: Within Functional Limits for tasks assessed                                        General Comments      Exercises     Assessment/Plan    PT Assessment Patient needs continued PT services  PT Problem List Decreased strength;Decreased range of motion;Decreased activity tolerance;Decreased mobility;Decreased balance;Decreased coordination;Decreased knowledge of use of DME;Decreased safety awareness;Decreased knowledge of precautions;Pain       PT Treatment Interventions DME instruction;Gait training;Stair training;Functional mobility training;Therapeutic activities;Balance training;Therapeutic exercise;Neuromuscular re-education;Patient/family education    PT Goals (Current goals can be found in the Care Plan section)  Acute Rehab PT Goals Patient Stated Goal: decrease pain PT Goal Formulation: With patient Time For Goal Achievement: 08/23/19 Potential to Achieve Goals: Good    Frequency Min 5X/week   Barriers to discharge        Co-evaluation               AM-PAC PT "6 Clicks" Mobility  Outcome Measure Help needed turning from your back to your side while in a flat bed without using bedrails?: None Help needed moving from lying on your back to sitting on the side of a flat bed without using bedrails?: None Help needed moving to and from a bed to a chair (including a wheelchair)?: None Help needed standing up from a chair using your arms (e.g., wheelchair or bedside chair)?: None Help needed to walk in hospital room?: A Little Help needed climbing 3-5 steps with a railing? : A Little 6 Click Score: 22    End of Session Equipment Utilized  During Treatment: Gait belt Activity Tolerance: Patient limited by pain Patient left: in bed;with call bell/phone within reach;with family/visitor present Nurse Communication: Mobility status PT Visit Diagnosis: Other abnormalities of gait and mobility (R26.89);Pain Pain - Right/Left: Right Pain - part of body: Leg    Time: [id number] PT Time Calculation (min) (ACUTE ONLY): 15 min   Charges:   PT Evaluation $PT Eval Low Complexity: 1 Low          Ginette Pitman, PT, DPT  Acute Rehabilitation Services Pager [phone] Office [phone]    Alessandra Bevels Arnulfo Batson 08/09/2019, 9:39 AM

## 2021-05-24 IMAGING — CR DG LUMBAR SPINE 2-3V
3 series · 3 of 3 positions shown · non-contrast
Comparison: None.

CLINICAL DATA: Localization

EXAM:
LUMBAR SPINE - 2-3 VIEW

[xtable lateral (1 of 3)]
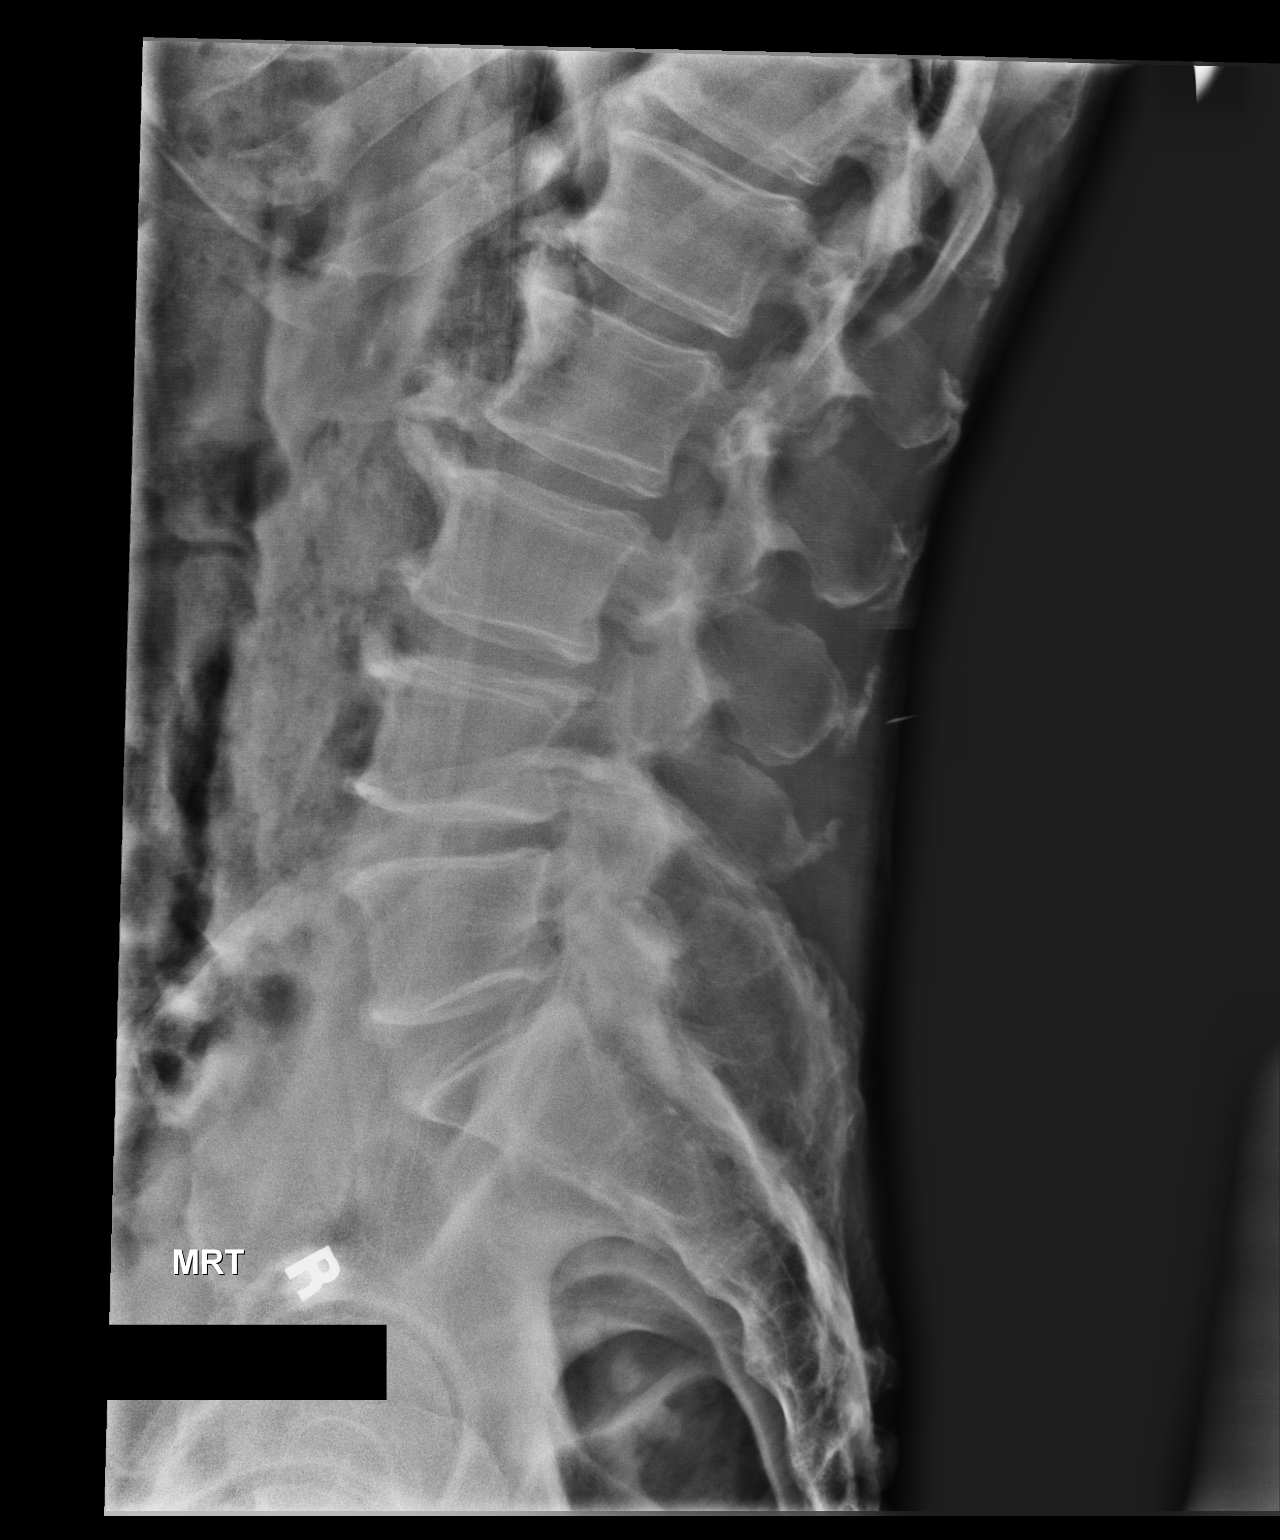

[xtable lateral (2 of 3)]
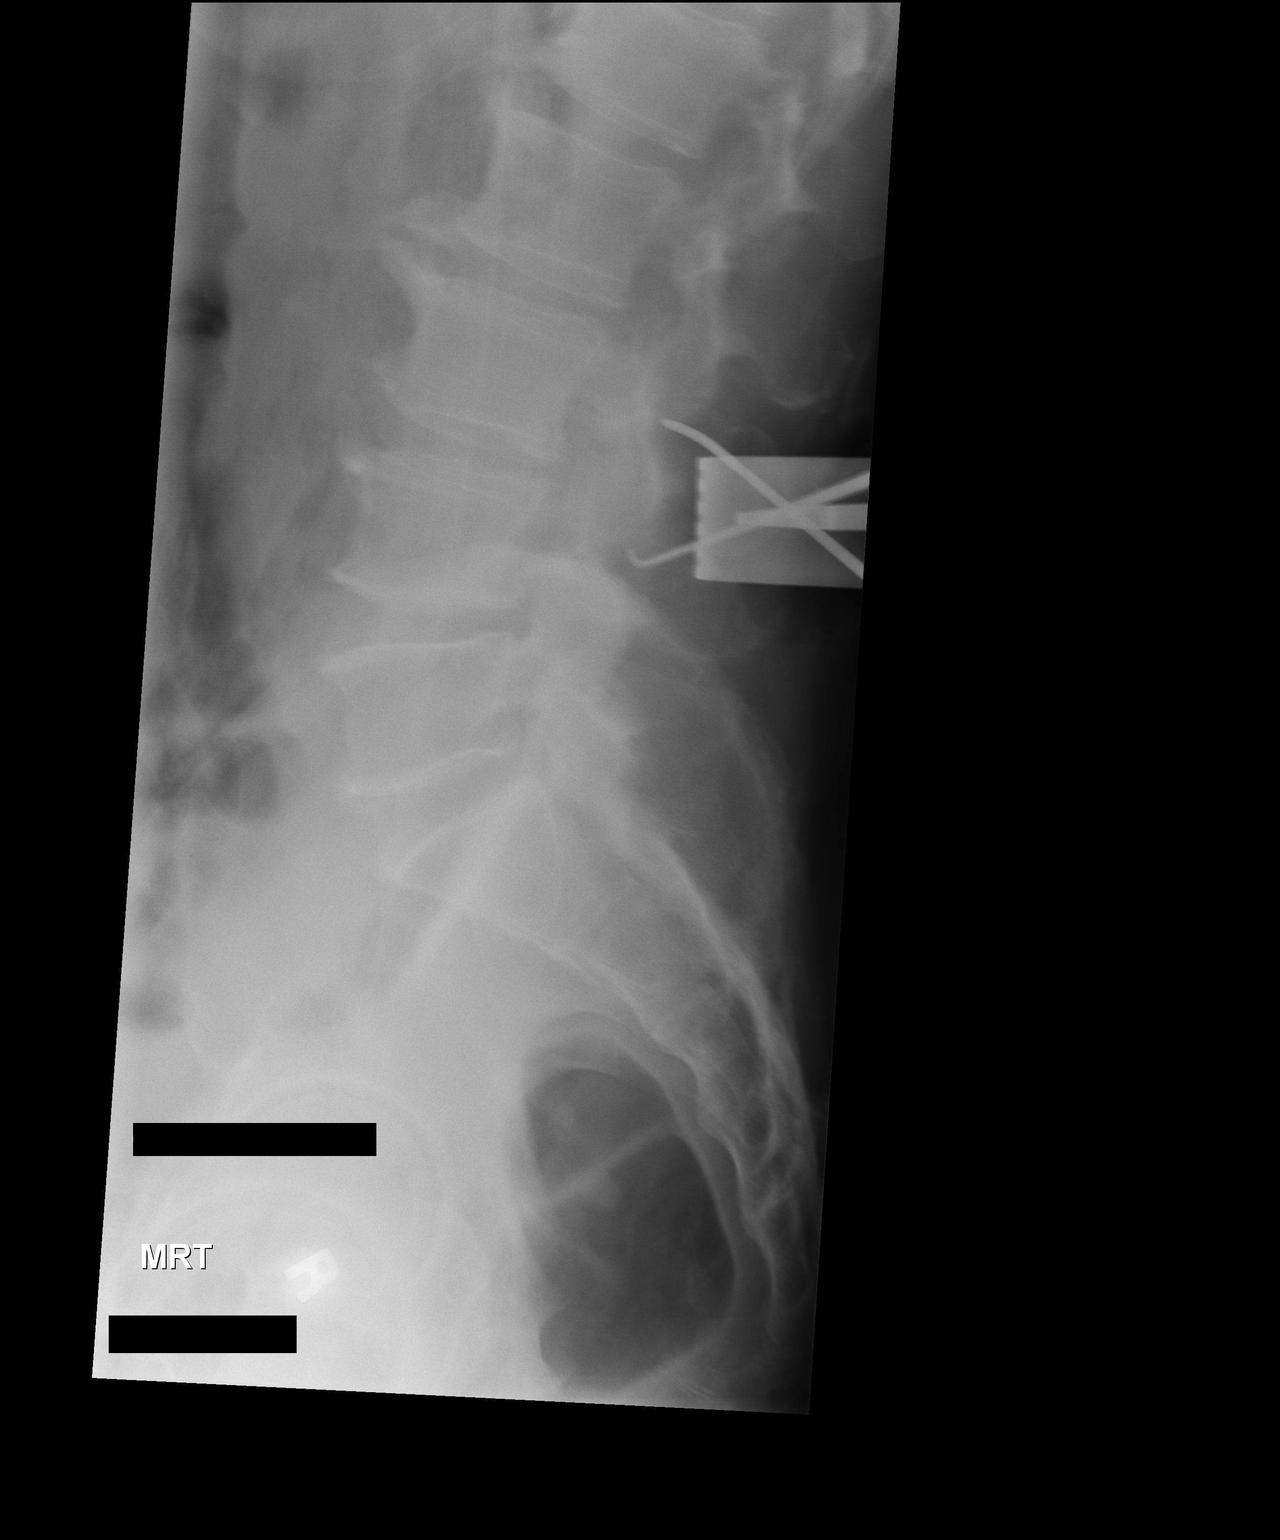

[xtable lateral (3 of 3)]
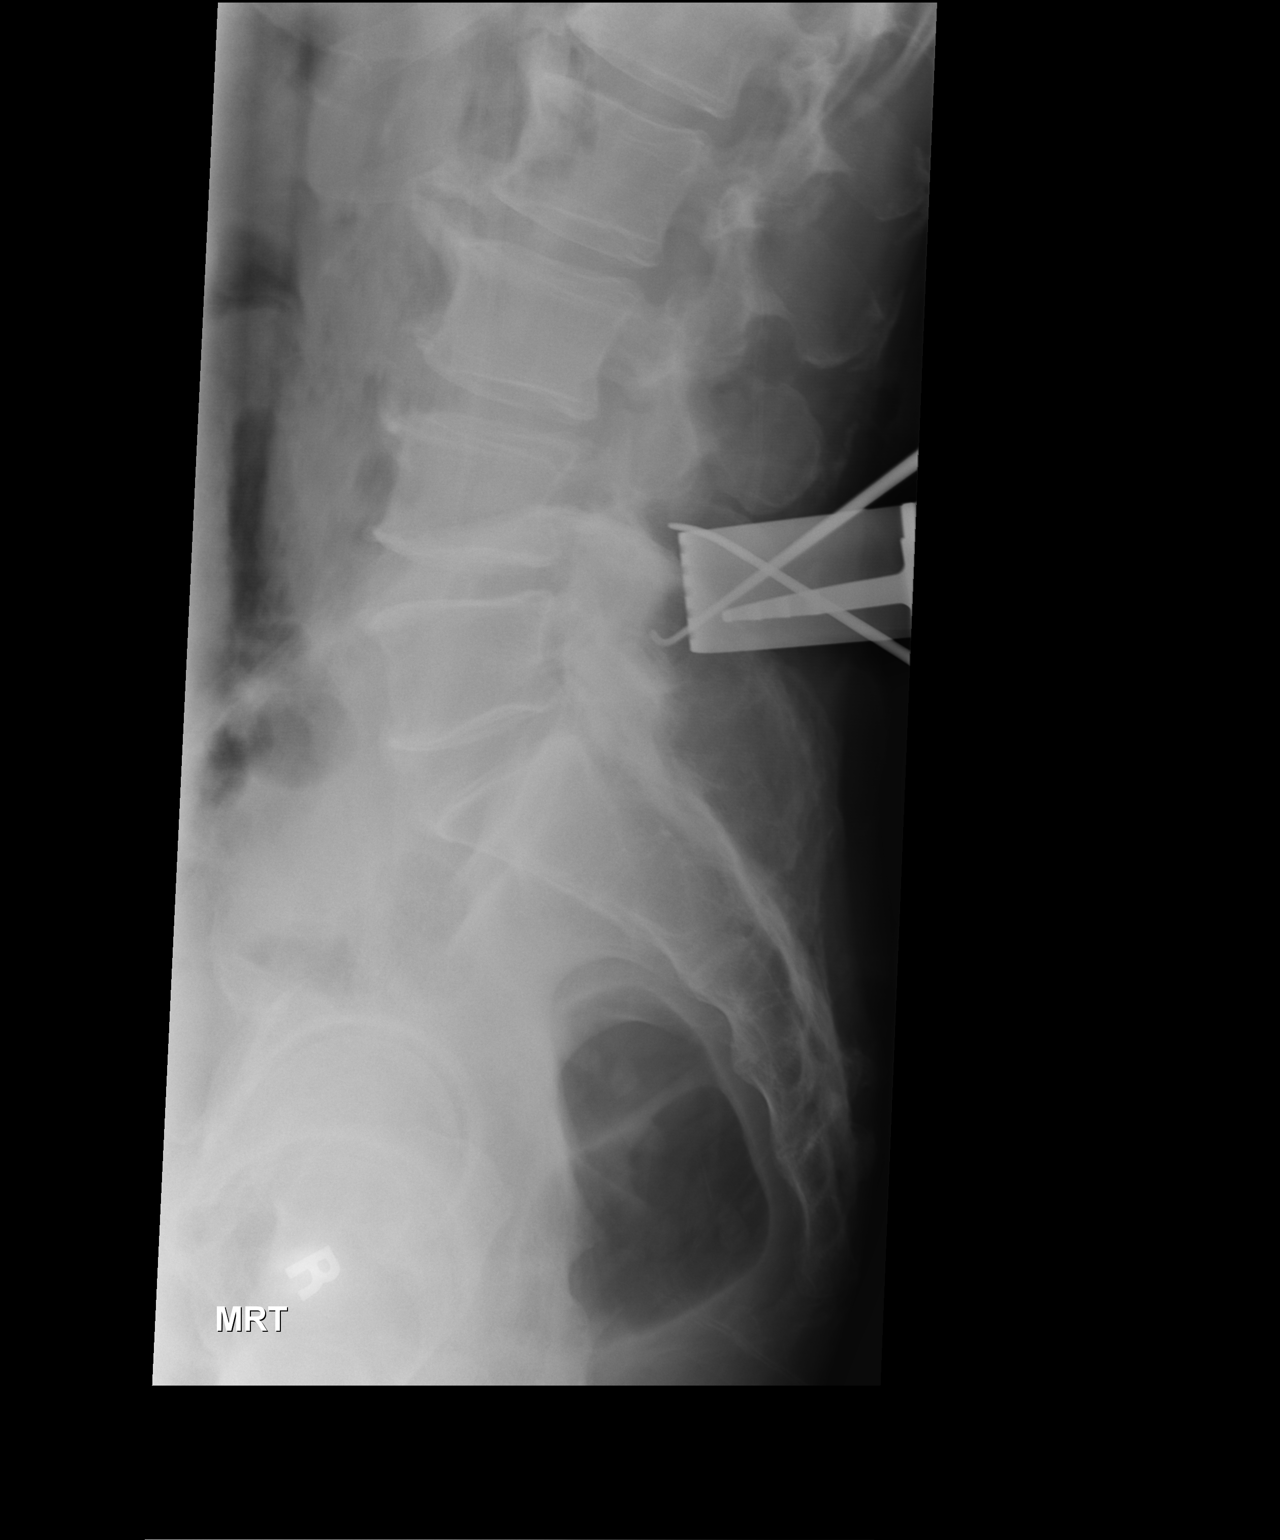

[3 of 3 positions shown; findings below may reference images not displayed]

FINDINGS: Overlying metallic marker is seen at the L4-L5 level. Disc height
loss with facet arthrosis most notable at L4-L5 and L5-S1. Anterior
osteophytes are seen in the lumbar spine.
IMPRESSION: Metallic marker at the L4-L5 level.
